# Patient Record
Sex: Female | Born: 1970 | Hispanic: No | Marital: Married
Health system: Southern US, Community
[De-identification: ages and names within clinical notes are randomized; demographics above are authoritative.]

---

## 2014-04-18 ENCOUNTER — Encounter (HOSPITAL_BASED_OUTPATIENT_CLINIC_OR_DEPARTMENT_OTHER): Payer: Self-pay

## 2014-04-18 ENCOUNTER — Emergency Department (HOSPITAL_BASED_OUTPATIENT_CLINIC_OR_DEPARTMENT_OTHER)
Admission: EM | Admit: 2014-04-18 | Discharge: 2014-04-18 | Disposition: A | Discharge status: Home / Self Care | Payer: BC Managed Care – PPO | Attending: Emergency Medicine | Admitting: Emergency Medicine

## 2014-04-18 DIAGNOSIS — N390 Urinary tract infection, site not specified: Secondary | ICD-10-CM | POA: Diagnosis not present

## 2014-04-18 DIAGNOSIS — Z3202 Encounter for pregnancy test, result negative: Secondary | ICD-10-CM | POA: Diagnosis not present

## 2014-04-18 DIAGNOSIS — R300 Dysuria: Secondary | ICD-10-CM | POA: Diagnosis present

## 2014-04-18 LAB — PREGNANCY, URINE: Preg Test, Ur: NEGATIVE

## 2014-04-18 LAB — URINALYSIS, ROUTINE W REFLEX MICROSCOPIC
BILIRUBIN URINE: NEGATIVE
Glucose, UA: NEGATIVE mg/dL
KETONES UR: 15 mg/dL — AB
Nitrite: POSITIVE — AB
PH: 5.5 (ref 5.0–8.0)
Protein, ur: NEGATIVE mg/dL
SPECIFIC GRAVITY, URINE: 1.02 (ref 1.005–1.030)
Urobilinogen, UA: 1 mg/dL (ref 0.0–1.0)

## 2014-04-18 LAB — URINE MICROSCOPIC-ADD ON

## 2014-04-18 MED ORDER — CEPHALEXIN 250 MG PO CAPS
500.0000 mg | ORAL_CAPSULE | Freq: Once | ORAL | Status: AC
  Administered 2014-04-18: 500 mg via ORAL
  Filled 2014-04-18: qty 2

## 2014-04-18 MED ORDER — PHENAZOPYRIDINE HCL 200 MG PO TABS: 200.0000 mg | ORAL_TABLET | Freq: Three times a day (TID) | ORAL | Status: DC

## 2014-04-18 MED ORDER — CEPHALEXIN 500 MG PO CAPS: 500.0000 mg | ORAL_CAPSULE | Freq: Four times a day (QID) | ORAL | Status: DC

## 2014-04-18 MED ORDER — PHENAZOPYRIDINE HCL 100 MG PO TABS
200.0000 mg | ORAL_TABLET | Freq: Once | ORAL | Status: DC
  Filled 2014-04-18: qty 2

## 2014-04-18 NOTE — ED Provider Notes (Signed)
CSN: [id number]     Arrival date & time 04/18/14  1935 History   First MD Initiated Contact with Patient 04/18/14 1943     Chief Complaint  Patient presents with  . Dysuria     (Consider location/radiation/quality/duration/timing/severity/associated sxs/prior Treatment) Patient is a 43 y.o. female presenting with dysuria. The history is provided by the patient.  Dysuria Pain quality:  Burning Duration:  2 days Timing:  Constant Progression:  Worsening Chronicity:  New Recent urinary tract infections: no   Relieved by:  Nothing Worsened by:  Nothing tried Ineffective treatments:  Phenazopyridine and cranberry juice Urinary symptoms: frequent urination   Urinary symptoms: no foul-smelling urine   Associated symptoms: no fever, no nausea, no vaginal discharge and no vomiting    Lynn Morales is a 43 y.o. female who presents to the ED with dysuria that started 2 days ago. She is not concerned about STI's as she has only sex with her husband and no vaginal discharge.   History reviewed. No pertinent past medical history. History reviewed. No pertinent past surgical history. No family history on file. History  Substance Use Topics  . Smoking status: Never Smoker   . Smokeless tobacco: Not on file  . Alcohol Use: Not on file   OB History    No data available     Review of Systems  Constitutional: Negative for fever.  Gastrointestinal: Negative for nausea and vomiting.  Genitourinary: Positive for dysuria. Negative for vaginal discharge.   All other systems negative   Allergies  Bactrim  Home Medications   Prior to Admission medications   Not on File   BP 128/75 mmHg  Pulse 98  Temp(Src) 98.6 F (37 C) (Oral)  Resp 18  Wt 145 lb (65.772 kg)  SpO2 98% Physical Exam  Constitutional: She is oriented to person, place, and time. She appears well-developed and well-nourished.  HENT:  Head: Normocephalic.  Eyes: EOM are normal.  Neck: Neck supple.   Cardiovascular: Normal rate.   Pulmonary/Chest: Effort normal.  Abdominal: Soft. There is no tenderness.  Genitourinary:  Patient does not want pelvic.  Musculoskeletal: Normal range of motion.  Neurological: She is alert and oriented to person, place, and time. No cranial nerve deficit.  Skin: Skin is warm and dry.  Psychiatric: She has a normal mood and affect. Her behavior is normal.  Nursing note and vitals reviewed.   ED Course  Procedures (including critical care time) Labs Review Results for orders placed or performed during the hospital encounter of 04/18/14 (from the past 24 hour(s))  Urinalysis, Routine w reflex microscopic     Status: Abnormal   Collection Time: 04/18/14  7:46 PM  Result Value Ref Range   Color, Urine ORANGE (A) YELLOW   APPearance CLOUDY (A) CLEAR   Specific Gravity, Urine 1.020 1.005 - 1.030   pH 5.5 5.0 - 8.0   Glucose, UA NEGATIVE NEGATIVE mg/dL   Hgb urine dipstick MODERATE (A) NEGATIVE   Bilirubin Urine NEGATIVE NEGATIVE   Ketones, ur 15 (A) NEGATIVE mg/dL   Protein, ur NEGATIVE NEGATIVE mg/dL   Urobilinogen, UA 1.0 0.0 - 1.0 mg/dL   Nitrite POSITIVE (A) NEGATIVE   Leukocytes, UA MODERATE (A) NEGATIVE  Pregnancy, urine     Status: None   Collection Time: 04/18/14  7:46 PM  Result Value Ref Range   Preg Test, Ur NEGATIVE NEGATIVE  Urine microscopic-add on     Status: Abnormal   Collection Time: 04/18/14  7:46 PM  Result Value  Ref Range   Squamous Epithelial / LPF MANY (A) RARE   WBC, UA TOO NUMEROUS TO COUNT <3 WBC/hpf   RBC / HPF 7-10 <3 RBC/hpf   Bacteria, UA MANY (A) RARE   Urine-Other FEW YEAST     MDM  43 y.o. female with dysuria, frequency and urgency x 2 days. Will treat for UTI. Stable for discharge without fever, n/v or signs of pyelo. Discussed with the patient clinical and lab findings and plan of care. All questioned fully answered. She will call return if any problems arise.    Medication List    TAKE these medications         cephALEXin 500 MG capsule  Commonly known as:  KEFLEX  Take 1 capsule (500 mg total) by mouth 4 (four) times daily.     phenazopyridine 200 MG tablet  Commonly known as:  PYRIDIUM  Take 1 tablet (200 mg total) by mouth 3 (three) times daily.           BurlingtonHope M Neese, NP 04/18/14 2306  Nelia Shiobert L Beaton, MD 04/19/14 [phone]

## 2014-04-18 NOTE — ED Notes (Signed)
Patient here with dysuria and urinary frequency x 2 days, denies fever

## 2014-04-21 LAB — URINE CULTURE: Colony Count: >=100000

## 2014-04-22 ENCOUNTER — Telehealth (HOSPITAL_BASED_OUTPATIENT_CLINIC_OR_DEPARTMENT_OTHER): Payer: Self-pay | Admitting: Emergency Medicine

## 2014-04-22 NOTE — Telephone Encounter (Signed)
Post ED Visit - Positive Culture Follow-up  Culture report reviewed by antimicrobial stewardship pharmacist: []  Wes Dulaney, Pharm.D., BCPS [x]  Celedonio MiyamotoJeremy Frens, Pharm.D., BCPS []  Georgina PillionElizabeth Martin, [address]Pharm[address]D., BCPS []  [place]h Pham, [place]arm.D., BCPS, AAHIVP []  Estella HuskMichelle Turner, Pharm.D., BCPS, AAHIVP []  Babs BertinHaley Baird, Pharm.D.   Positive urine  Culture E. Coli Treated with cephalexin, organism sensitive to the same and no further patient follow-up is required at this time.  Berle MullMiller, Sumaya Riedesel 04/22/2014, 3:58 PM

## 2017-02-26 ENCOUNTER — Ambulatory Visit: Payer: Self-pay | Admitting: Podiatry

## 2018-05-23 ENCOUNTER — Ambulatory Visit (INDEPENDENT_AMBULATORY_CARE_PROVIDER_SITE_OTHER): Payer: Medicaid Other | Admitting: Family Medicine

## 2018-05-23 ENCOUNTER — Encounter: Payer: Self-pay | Admitting: Family Medicine

## 2018-05-23 VITALS — BP 124/75 | HR 72 | Ht 64.0 in | Wt 160.0 lb

## 2018-05-23 DIAGNOSIS — Z30433 Encounter for removal and reinsertion of intrauterine contraceptive device: Secondary | ICD-10-CM

## 2018-05-23 MED ORDER — LEVONORGESTREL 20 MCG/24HR IU IUD
INTRAUTERINE_SYSTEM | Freq: Once | INTRAUTERINE | Status: AC
  Administered 2018-05-23: 1 via INTRAUTERINE

## 2018-05-23 NOTE — Patient Instructions (Signed)

## 2018-05-23 NOTE — Progress Notes (Signed)
  IUD Removal  Patient was in the dorsal lithotomy position, normal external genitalia was noted.  A speculum was placed in the patient's vagina, normal discharge was noted, no lesions. The multiparous cervix was visualized, no lesions, no abnormal discharge,  and was swabbed with Betadine using scopettes.  The strings of the IUD was grasped and pulled using ring forceps.  The IUD was successfully removed in its entirety.  Patient tolerated the procedure well.    IUD Procedure Note Patient identified, informed consent performed, signed copy in chart, time out was performed.  Urine pregnancy test negative.  Speculum placed in the vagina.  Cervix visualized.  Cleaned with Betadine x 2.  Grasped anteriorly with a single tooth tenaculum.  Uterus sounded to 8 cm.  Mirena  IUD placed per manufacturer's recommendations.  Strings trimmed to 3 cm. Tenaculum was removed, good hemostasis noted.  Patient tolerated procedure well.   Patient given post procedure instructions and Mirena care card with expiration date.  Patient is asked to check IUD strings periodically and follow up in 4-6 weeks for IUD check.

## 2018-05-23 NOTE — Addendum Note (Signed)
Addended by: Lorelle GibbsWILSON, Vendetta Pittinger L on: 05/23/2018 11:11 AM   Modules accepted: Orders

## 2018-06-20 ENCOUNTER — Other Ambulatory Visit (HOSPITAL_COMMUNITY)
Admission: RE | Admit: 2018-06-20 | Discharge: 2018-06-20 | Disposition: A | Discharge status: Home / Self Care | Payer: Medicaid Other | Source: Ambulatory Visit | Attending: Obstetrics & Gynecology | Admitting: Obstetrics & Gynecology

## 2018-06-20 ENCOUNTER — Ambulatory Visit (INDEPENDENT_AMBULATORY_CARE_PROVIDER_SITE_OTHER): Payer: Medicaid Other | Admitting: Obstetrics & Gynecology

## 2018-06-20 ENCOUNTER — Encounter: Payer: Self-pay | Admitting: Obstetrics & Gynecology

## 2018-06-20 VITALS — BP 146/84 | HR 75 | Ht 64.0 in | Wt 160.0 lb

## 2018-06-20 DIAGNOSIS — Z01419 Encounter for gynecological examination (general) (routine) without abnormal findings: Secondary | ICD-10-CM | POA: Diagnosis present

## 2018-06-20 DIAGNOSIS — Z0000 Encounter for general adult medical examination without abnormal findings: Secondary | ICD-10-CM | POA: Diagnosis not present

## 2018-06-20 DIAGNOSIS — Z1239 Encounter for other screening for malignant neoplasm of breast: Secondary | ICD-10-CM | POA: Diagnosis present

## 2018-06-20 NOTE — Patient Instructions (Signed)
Mammogram  A mammogram is an X-ray of the breasts that is done to check for abnormal changes. This procedure can screen for and detect any changes that may suggest breast cancer. A mammogram can also identify other changes and variations in the breast, such as:   Inflammation of the breast tissue (mastitis).   An infected area that contains a collection of pus (abscess).   A fluid-filled sac (cyst).   Fibrocystic changes. This is when breast tissue becomes denser, which can make the tissue feel rope-like or uneven under the skin.   Tumors that are not cancerous (benign).  Tell a health care provider about:   Any allergies you have.   If you have breast implants.   If you have had previous breast disease, biopsy, or surgery.   If you are breastfeeding.   Any possibility that you could be pregnant, if this applies.   If you are younger than age 25.   If you have a family history of breast cancer.  What are the risks?  Generally, this is a safe procedure. However, problems may occur, including:   Exposure to radiation. Radiation levels are very low with this test.   The results being misinterpreted.   The need for further tests.   The inability of the mammogram to detect certain cancers.  What happens before the procedure?   Schedule your test about 1-2 weeks after your menstrual period. This is usually when your breasts are the least tender.   If you have had a mammogram done at a different facility in the past, get the mammogram X-rays or have them sent to your current exam facility in order to compare them.   Wash your breasts and under your arms the day of the test.   Do not wear deodorants, perfumes, lotions, or powders anywhere on your body on the day of the test.   Remove any jewelry from your neck.   Wear clothes that you can change into and out of easily.  What happens during the procedure?   You will undress from the waist up and put on a gown.   You will stand in front of the X-ray  machine.   Each breast will be placed between two plastic or glass plates. The plates will compress your breast for a few seconds. Try to stay as relaxed as possible during the procedure. This does not cause any harm to your breasts and any discomfort you feel will be very brief.   X-rays will be taken from different angles of each breast.  The procedure may vary among health care providers and hospitals.  What happens after the procedure?   The mammogram will be examined by a specialist (radiologist).   You may need to repeat certain parts of the test, depending on the quality of the images. This is commonly done if the radiologist needs a better view of the breast tissue.   Ask when your test results will be ready. Make sure you get your test results.   You may resume your normal activities.  Summary   A mammogram is an X-ray of the breasts that is done to check for abnormal changes. This procedure can screen for and detect any changes that may suggest breast cancer.   If you have had a mammogram done at a different facility in the past, get the mammogram X-rays or have them sent to your current exam facility in order to compare them.   Ask when your test   results will be ready. Make sure you get your test results.  This information is not intended to replace advice given to you by your health care provider. Make sure you discuss any questions you have with your health care provider.  Document Released: 05/26/2000 Document Revised: 01/11/2017 Document Reviewed: 08/07/2014  Elsevier Interactive Patient Education  2019 Elsevier Inc[employer]

## 2018-06-20 NOTE — Progress Notes (Signed)
Subjective:     Lynn Morales is a 48 y.o. female here for a routine exam. [id number] LMP LnIUD Nov 2019 amenorrhea with LnIUD. Pt has LnIUD placed 05/23/2018.  Pt denies problems voiding on incontinence.     Pt does not have a primary care provider.    Gynecologic History No LMP recorded. (Menstrual status: IUD). Contraception: IUD Last Pap: 2018 Results were: normal [place] Last mammogram: maybe last year not sure. Thinks she was scheudled for every other year results were: normal  Obstetric History OB History  Gravida Para Term Preterm AB Living  4 2 2   2 2   SAB TAB Ectopic Multiple Live Births  1 1     2     # Outcome Date GA Lbr Len/2nd Weight Sex Delivery Anes PTL Lv  4 Term 2007 [redacted]w[redacted]d   M Vag-Spont EPI  LIV  3 TAB 2006          2 Term 2001 [redacted]w[redacted]d   F Vag-Spont EPI  LIV  1 SAB 2000           The following portions of the patient's history were reviewed and updated as appropriate: allergies, current medications, past family history, past medical history, past social history, past surgical history and problem list.  Review of Systems Pertinent items are noted in HPI.    Objective:  BP (!) 146/84   Pulse 75   Ht 5\' 4"  (1.626 m)   Wt 160 lb (72.6 kg)   BMI 27.46 kg/m   General Appearance:    Alert, cooperative, no distress, appears stated age  Head:    Normocephalic, without obvious abnormality, atraumatic  Eyes:    conjunctiva/corneas clear, EOM's intact, both eyes  Ears:    Normal external ear canals, both ears  Nose:   Nares normal, septum midline, mucosa normal, no drainage    or sinus tenderness  Throat:   Lips, mucosa, and tongue normal; teeth and gums normal  Neck:   Supple, symmetrical, trachea midline, no adenopathy;    thyroid:  no enlargement/tenderness/nodules  Back:     Symmetric, no curvature, ROM normal, no CVA tenderness  Lungs:     Clear to auscultation bilaterally, respirations unlabored  Chest Wall:    No tenderness or deformity   Heart:    Regular  rate and rhythm, S1 and S2 normal, no murmur, rub   or gallop  Breast Exam:    No tenderness, masses, or nipple abnormality  Abdomen:     Soft, non-tender, bowel sounds active all four quadrants,    no masses, no organomegaly  Genitalia:    Normal female without lesion, discharge or tenderness     Extremities:   Extremities normal, atraumatic, no cyanosis or edema  Pulses:   2+ and symmetric all extremities  Skin:   Skin color, texture, turgor normal, no rashes or lesions     Assessment:    Healthy female exam.   breast cancer screening    Plan:    Mammogram ordered. Follow up in: 1 year.    Labs: CBC, CMP, TSH, HgbA1c, Lipid panel  Theresia Pree L. Harraway-Smith, M.D., [place]

## 2018-06-21 LAB — CBC
Hematocrit: 41 % (ref 34.0–46.6)
Hemoglobin: 14.1 g/dL (ref 11.1–15.9)
MCH: 29.1 pg (ref 26.6–33.0)
MCHC: 34.4 g/dL (ref 31.5–35.7)
MCV: 85 fL (ref 79–97)
Platelets: 296 10*3/uL (ref 150–450)
RBC: 4.85 x10E6/uL (ref 3.77–5.28)
RDW: 12.7 % (ref 11.7–15.4)
WBC: 8.9 10*3/uL (ref 3.4–10.8)

## 2018-06-21 LAB — COMPREHENSIVE METABOLIC PANEL
A/G RATIO: 1.1 — AB (ref 1.2–2.2)
ALBUMIN: 4.1 g/dL (ref 3.5–5.5)
ALT: 10 IU/L (ref 0–32)
AST: 13 IU/L (ref 0–40)
Alkaline Phosphatase: 61 IU/L (ref 39–117)
BUN/Creatinine Ratio: 14 (ref 9–23)
BUN: 10 mg/dL (ref 6–24)
Bilirubin Total: 0.3 mg/dL (ref 0.0–1.2)
CO2: 24 mmol/L (ref 20–29)
Calcium: 9.8 mg/dL (ref 8.7–10.2)
Chloride: 98 mmol/L (ref 96–106)
Creatinine, Ser: 0.7 mg/dL (ref 0.57–1.00)
GFR calc Af Amer: 119 mL/min/{1.73_m2} (ref >59)
GFR calc non Af Amer: 104 mL/min/{1.73_m2} (ref >59)
Globulin, Total: 3.9 g/dL (ref 1.5–4.5)
Glucose: 85 mg/dL (ref 65–99)
Potassium: 4.2 mmol/L (ref 3.5–5.2)
Sodium: 135 mmol/L (ref 134–144)
Total Protein: 8 g/dL (ref 6.0–8.5)

## 2018-06-21 LAB — CYTOLOGY - PAP
Diagnosis: NEGATIVE
HPV: NOT DETECTED

## 2018-06-21 LAB — HEMOGLOBIN A1C
Est. average glucose Bld gHb Est-mCnc: 111 mg/dL
Hgb A1c MFr Bld: 5.5 % (ref 4.8–5.6)

## 2018-06-21 LAB — TSH: TSH: 6.81 u[IU]/mL — ABNORMAL HIGH (ref 0.450–4.500)

## 2018-06-25 LAB — T4: T4, Total: 9.4 ug/dL (ref 4.5–12.0)

## 2018-06-25 LAB — T3: T3, Total: 103 ng/dL (ref 71–180)

## 2018-06-27 ENCOUNTER — Ambulatory Visit (HOSPITAL_BASED_OUTPATIENT_CLINIC_OR_DEPARTMENT_OTHER)
Admission: RE | Admit: 2018-06-27 | Discharge: 2018-06-27 | Disposition: A | Discharge status: Home / Self Care | Payer: Medicaid Other | Source: Ambulatory Visit | Attending: Obstetrics & Gynecology | Admitting: Obstetrics & Gynecology

## 2018-06-27 DIAGNOSIS — Z1239 Encounter for other screening for malignant neoplasm of breast: Secondary | ICD-10-CM | POA: Diagnosis present

## 2018-06-27 DIAGNOSIS — Z01419 Encounter for gynecological examination (general) (routine) without abnormal findings: Secondary | ICD-10-CM | POA: Insufficient documentation

## 2018-06-28 ENCOUNTER — Other Ambulatory Visit: Payer: Self-pay | Admitting: Obstetrics & Gynecology

## 2018-06-28 DIAGNOSIS — R928 Other abnormal and inconclusive findings on diagnostic imaging of breast: Secondary | ICD-10-CM

## 2018-07-04 ENCOUNTER — Ambulatory Visit
Admission: RE | Admit: 2018-07-04 | Discharge: 2018-07-04 | Disposition: A | Discharge status: Home / Self Care | Payer: Medicaid Other | Source: Ambulatory Visit | Attending: Obstetrics & Gynecology | Admitting: Obstetrics & Gynecology

## 2018-07-04 DIAGNOSIS — R928 Other abnormal and inconclusive findings on diagnostic imaging of breast: Secondary | ICD-10-CM

## 2019-10-30 ENCOUNTER — Telehealth: Payer: Self-pay

## 2019-10-30 ENCOUNTER — Emergency Department (INDEPENDENT_AMBULATORY_CARE_PROVIDER_SITE_OTHER)
Admission: EM | Admit: 2019-10-30 | Discharge: 2019-10-30 | Disposition: A | Discharge status: Home / Self Care | Payer: Medicaid Other | Source: Home / Self Care

## 2019-10-30 ENCOUNTER — Other Ambulatory Visit: Payer: Self-pay

## 2019-10-30 DIAGNOSIS — R110 Nausea: Secondary | ICD-10-CM | POA: Diagnosis not present

## 2019-10-30 DIAGNOSIS — R8290 Unspecified abnormal findings in urine: Secondary | ICD-10-CM

## 2019-10-30 DIAGNOSIS — R1084 Generalized abdominal pain: Secondary | ICD-10-CM

## 2019-10-30 LAB — POCT CBC W AUTO DIFF (K'VILLE URGENT CARE)

## 2019-10-30 LAB — COMPLETE METABOLIC PANEL WITH GFR
AG Ratio: 1.2 (calc) (ref 1.0–2.5)
ALT: 12 U/L (ref 6–29)
AST: 16 U/L (ref 10–35)
Albumin: 4.2 g/dL (ref 3.6–5.1)
Alkaline phosphatase (APISO): 55 U/L (ref 31–125)
BUN: 8 mg/dL (ref 7–25)
CO2: 29 mmol/L (ref 20–32)
Calcium: 9.7 mg/dL (ref 8.6–10.2)
Chloride: 102 mmol/L (ref 98–110)
Creat: 0.66 mg/dL (ref 0.50–1.10)
GFR, Est African American: 121 mL/min/{1.73_m2} (ref >=60)
GFR, Est Non African American: 104 mL/min/{1.73_m2} (ref >=60)
Globulin: 3.6 g/dL (calc) (ref 1.9–3.7)
Glucose, Bld: 93 mg/dL (ref 65–99)
Potassium: 4.1 mmol/L (ref 3.5–5.3)
Sodium: 137 mmol/L (ref 135–146)
Total Bilirubin: 0.3 mg/dL (ref 0.2–1.2)
Total Protein: 7.8 g/dL (ref 6.1–8.1)

## 2019-10-30 LAB — POCT URINALYSIS DIP (MANUAL ENTRY)
Bilirubin, UA: NEGATIVE
Glucose, UA: NEGATIVE mg/dL
Ketones, POC UA: NEGATIVE mg/dL
Nitrite, UA: NEGATIVE
Protein Ur, POC: NEGATIVE mg/dL
Spec Grav, UA: 1.02 (ref 1.010–1.025)
Urobilinogen, UA: 0.2 E.U./dL
pH, UA: 8.5 — AB (ref 5.0–8.0)

## 2019-10-30 LAB — LIPASE: Lipase: 24 U/L (ref 7–60)

## 2019-10-30 MED ORDER — ONDANSETRON 4 MG PO TBDP: 4.0000 mg | ORAL_TABLET | Freq: Three times a day (TID) | ORAL | 0 refills | Status: DC | PRN

## 2019-10-30 NOTE — ED Triage Notes (Addendum)
Pt c/o intermittent stomach pain x 3 days. No diarrhea. Some nausea, no vomiting. Says she took tums before coming into UC, which helped some. Pain 5/10

## 2019-10-30 NOTE — Telephone Encounter (Signed)
Pt notified of normal lab results. Will be notified if anything comes back abnormal on the UCX.

## 2019-10-30 NOTE — ED Provider Notes (Signed)
[facility]    CSN: [id number] Arrival date & time: 10/30/19  1252      History   Chief Complaint No chief complaint on file.   HPI Lynn Morales is a 49 y.o. female.   HPI  Lynn Morales is a 49 y.o. female presenting to UC with c/o intermittent generalized abdominal pain with associated nausea without vomiting or diarrhea.  Symptoms started 3 days ago. Pain has improved some since initial onset.  She took Tums PTA, with moderate relief. Pain is 5/10 at worst. Denies fever, chills. Denies urinary or URI symptoms. No sick contacts or recent travel. Denies chest pain or back pain. No hx of kidney stones. LMP: pt has an IUD, no longer has periods.   No past medical history on file.  There are no problems to display for this patient.   No past surgical history on file.  OB History    Gravida  4   Para  2   Term  2   Preterm      AB  2   Living  2     SAB  1   TAB  1   Ectopic      Multiple      Live Births  2            Home Medications    Prior to Admission medications   Medication Sig Start Date End Date Taking? Authorizing Provider  fexofenadine (ALLEGRA) 180 MG tablet Take 180 mg by mouth daily.    [provider]  Multiple Vitamin (MULTIVITAMIN) capsule Take 1 capsule by mouth daily.    [provider]  ondansetron (ZOFRAN ODT) 4 MG disintegrating tablet Take 1 tablet (4 mg total) by mouth every 8 (eight) hours as needed for nausea or vomiting. 10/30/19   Lurene Shadow, PA-C    Family History Family History  Problem Relation Age of Onset  . Diabetes Mother     Social History Social History   Tobacco Use  . Smoking status: Never Smoker  . Smokeless tobacco: Never Used  Substance Use Topics  . Alcohol use: Never  . Drug use: Never     Allergies   Patient has no known allergies.   Review of Systems Review of Systems  Constitutional: Negative for chills and fever.  HENT: Negative for congestion,  ear pain, sore throat, trouble swallowing and voice change.   Respiratory: Negative for cough and shortness of breath.   Cardiovascular: Negative for chest pain and palpitations.  Gastrointestinal: Positive for abdominal pain and nausea. Negative for diarrhea and vomiting.  Musculoskeletal: Negative for arthralgias, back pain and myalgias.  Skin: Negative for rash.  Neurological: Negative for dizziness, light-headedness and headaches.  All other systems reviewed and are negative.    Physical Exam Triage Vital Signs ED Triage Vitals  Enc Vitals Group     BP 10/30/19 1308 (!) 145/90     Pulse Rate 10/30/19 1308 77     Resp 10/30/19 1308 18     Temp 10/30/19 1308 98.4 F (36.9 C)     Temp Source 10/30/19 1308 Oral     SpO2 10/30/19 1308 98 %     Weight --      Height --      Head Circumference --      Peak Flow --      Pain Score 10/30/19 1310 5     Pain Loc --      Pain Edu? --  Excl. in GC? --    No data found.  Updated Vital Signs BP (!) 145/90 (BP Location: Left Arm)   Pulse 77   Temp 98.4 F (36.9 C) (Oral)   Resp 18   SpO2 98%   Visual Acuity Right Eye Distance:   Left Eye Distance:   Bilateral Distance:    Right Eye Near:   Left Eye Near:    Bilateral Near:     Physical Exam Vitals and nursing note reviewed.  Constitutional:      General: She is not in acute distress.    Appearance: Normal appearance. She is well-developed. She is not ill-appearing, toxic-appearing or diaphoretic.  HENT:     Head: Normocephalic and atraumatic.     Mouth/Throat:     Mouth: Mucous membranes are moist.  Eyes:     General: No scleral icterus.    Conjunctiva/sclera: Conjunctivae normal.  Cardiovascular:     Rate and Rhythm: Normal rate and regular rhythm.  Pulmonary:     Effort: Pulmonary effort is normal. No respiratory distress.     Breath sounds: Normal breath sounds.  Abdominal:     General: There is no distension.     Palpations: Abdomen is soft. There is  no mass.     Tenderness: There is generalized abdominal tenderness. There is no right CVA tenderness, left CVA tenderness, guarding or rebound.     Hernia: No hernia is present.  Musculoskeletal:        General: Normal range of motion.     Cervical back: Normal range of motion.  Skin:    General: Skin is warm and dry.  Neurological:     Mental Status: She is alert and oriented to person, place, and time.  Psychiatric:        Behavior: Behavior normal.      UC Treatments / Results  Labs (all labs ordered are listed, but only abnormal results are displayed) Labs Reviewed  POCT URINALYSIS DIP (MANUAL ENTRY) - Abnormal; Notable for the following components:      Result Value   Blood, UA trace-lysed (*)    pH, UA 8.5 (*)    Leukocytes, UA Small (1+) (*)    All other components within normal limits  URINE CULTURE  COMPLETE METABOLIC PANEL WITH GFR  LIPASE  POCT CBC W AUTO DIFF ([facility])    EKG   Radiology No results found.  Procedures Procedures (including critical care time)  Medications Ordered in UC Medications - No data to display  Initial Impression / Assessment and Plan / UC Course  I have reviewed the triage vital signs and the nursing notes.  Pertinent labs & imaging results that were available during my care of the patient were reviewed by me and considered in my medical decision making (see chart for details).     Pt appears well, NAD  Mild diffuse tenderness on abdominal exam, no evidence of acute abdomen. CBC: unremarkable UA: small leukocytes, however, pt denies urinary symptoms, will hold off on antibiotics while culture pending. CMP and Lipase: stat labs pending Suspect viral illness at this time. Discussed symptoms that warrant emergent care in the ED. AVS provided.  Final Clinical Impressions(s) / UC Diagnoses   Final diagnoses:  Generalized abdominal pain  Nausea without vomiting  Abnormal finding on urinalysis     Discharge  Instructions      Be sure to get a lot of rest and stay well hydrated with sports drinks, water, diluted juices,  and clear sodas.  Avoid fried fatty food, spicy food, and milk as these foods can cause worsening stomach upset.   You will be notified later today of your lab results, especially if abnormal. If symptoms worsen between now and then- worsening abdominal pain, unable to keep down fluids, blood in stool, dizziness/passing out, or other new concerning symptoms develop, please call 911 or have someone drive you to the closest hospital.     ED Prescriptions    Medication Sig Dispense Auth. Provider   ondansetron (ZOFRAN ODT) 4 MG disintegrating tablet Take 1 tablet (4 mg total) by mouth every 8 (eight) hours as needed for nausea or vomiting. 10 tablet Lurene Shadow, PA-C     PDMP not reviewed this encounter.   Lurene Shadow, [place] 10/30/19 1447

## 2019-10-30 NOTE — Discharge Instructions (Signed)
  Be sure to get a lot of rest and stay well hydrated with sports drinks, water, diluted juices, and clear sodas.  Avoid fried fatty food, spicy food, and milk as these foods can cause worsening stomach upset.   You will be notified later today of your lab results, especially if abnormal. If symptoms worsen between now and then- worsening abdominal pain, unable to keep down fluids, blood in stool, dizziness/passing out, or other new concerning symptoms develop, please call 911 or have someone drive you to the closest hospital.

## 2019-10-31 ENCOUNTER — Encounter (HOSPITAL_BASED_OUTPATIENT_CLINIC_OR_DEPARTMENT_OTHER): Payer: Self-pay

## 2019-10-31 LAB — URINE CULTURE
MICRO NUMBER:: 10500907
SPECIMEN QUALITY:: ADEQUATE

## 2020-03-12 IMAGING — MG DIGITAL SCREENING BILATERAL MAMMOGRAM WITH TOMO AND CAD
8 series · 8 of 24 positions shown · non-contrast
Comparison: None

CLINICAL DATA: Screening.

EXAM:
DIGITAL SCREENING BILATERAL MAMMOGRAM WITH TOMO AND CAD

[R MLO synth-2D]
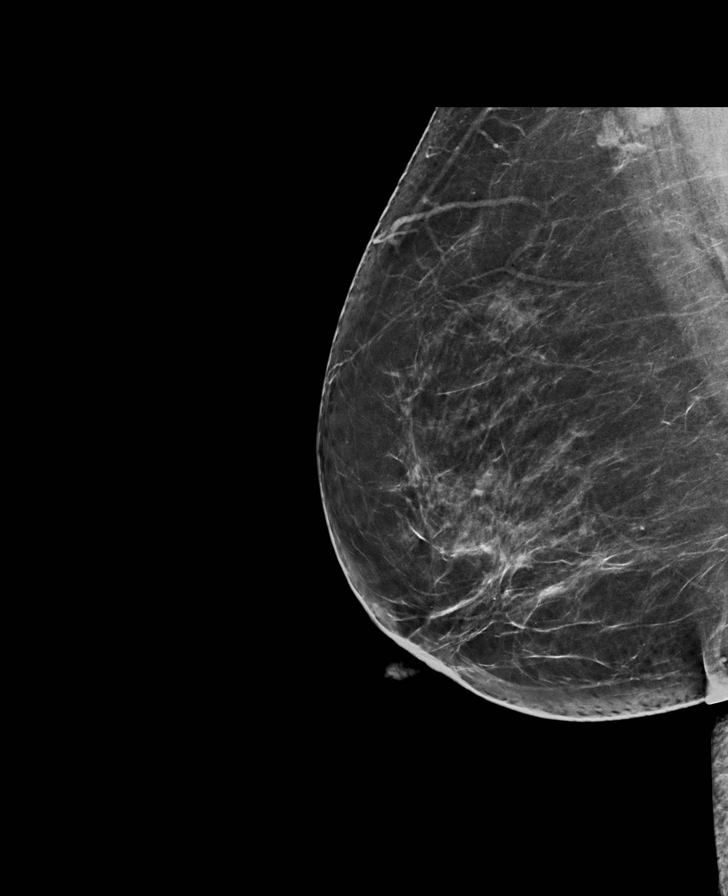

[L CC synth-2D]
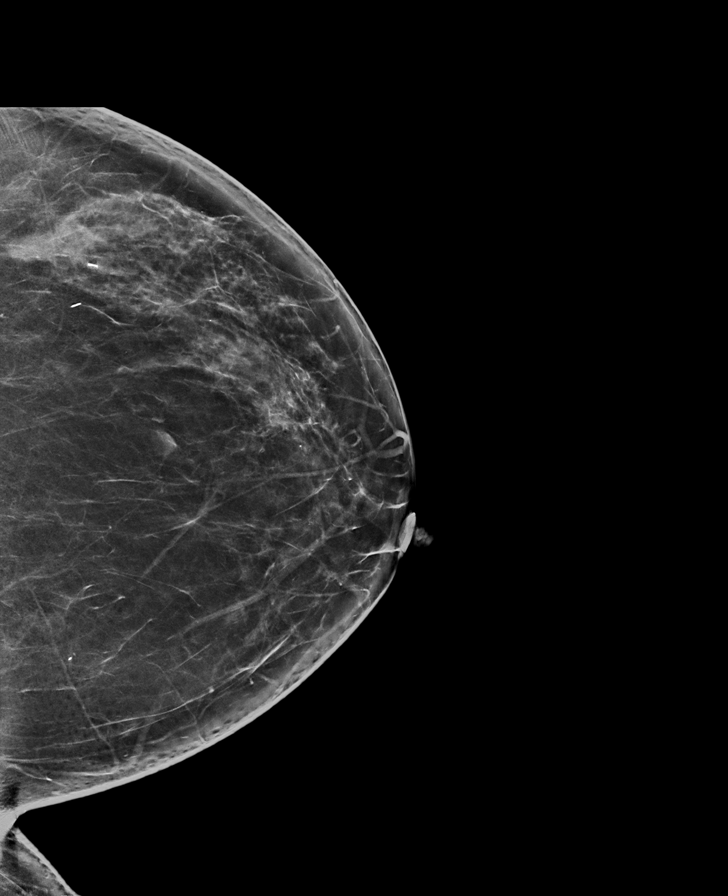

[R CC synth-2D]
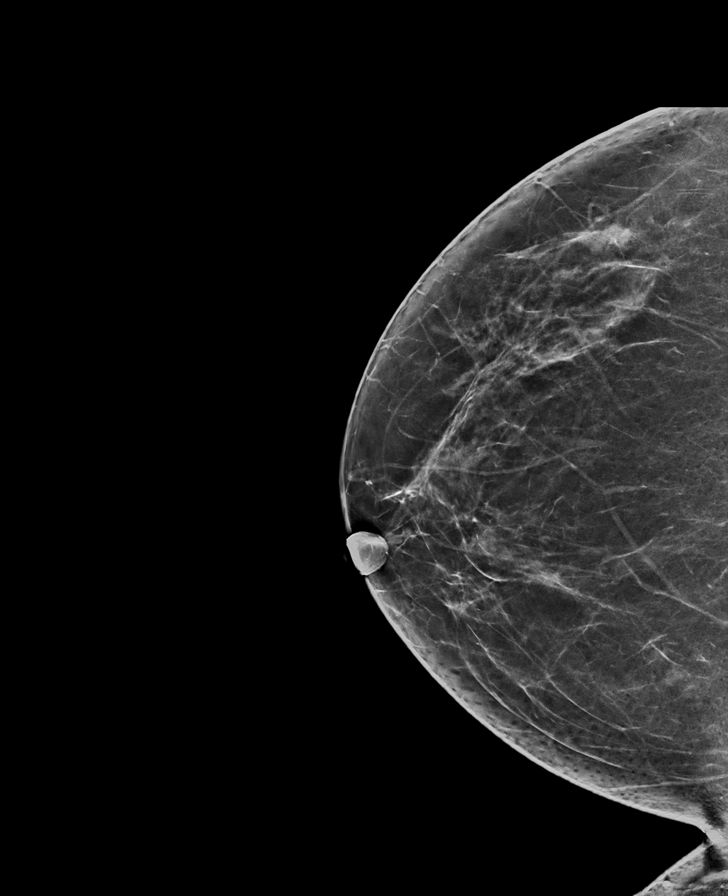

[L MLO synth-2D]
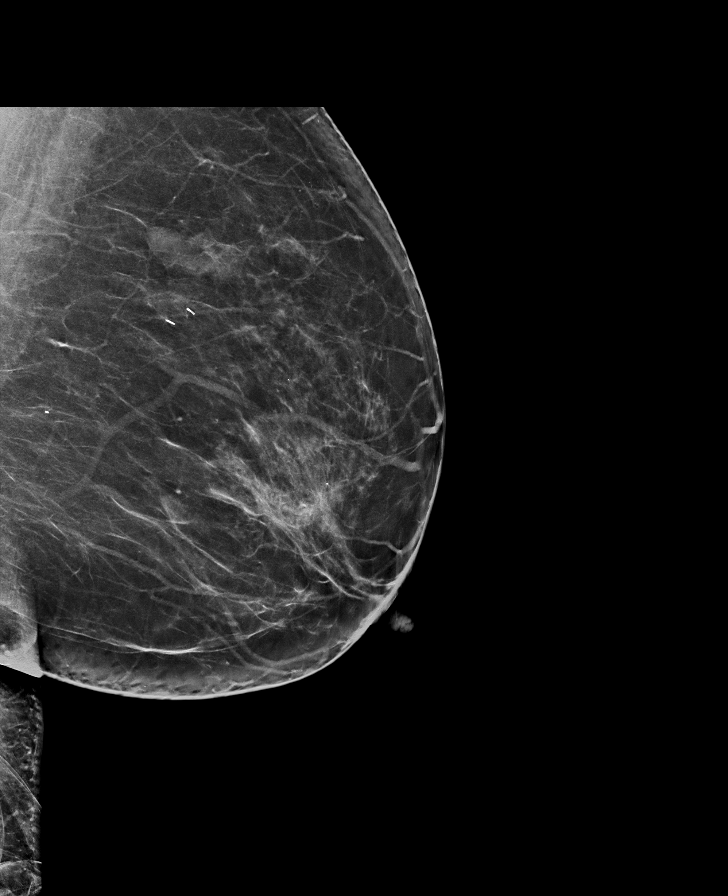

[R MLO tomo · tomo slice 40/79.0]
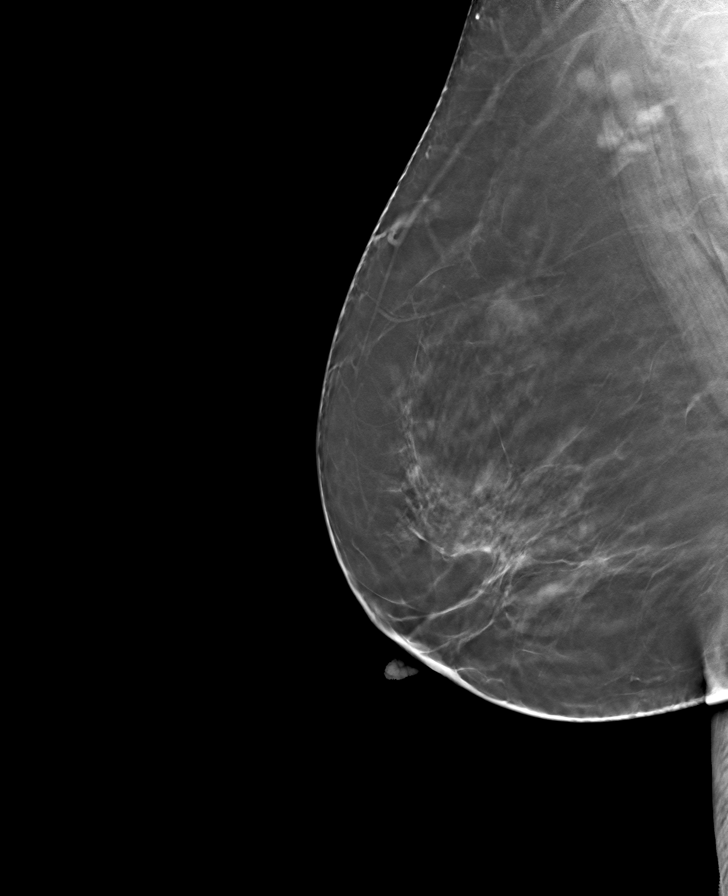

[L CC tomo · tomo slice 38/75.0]
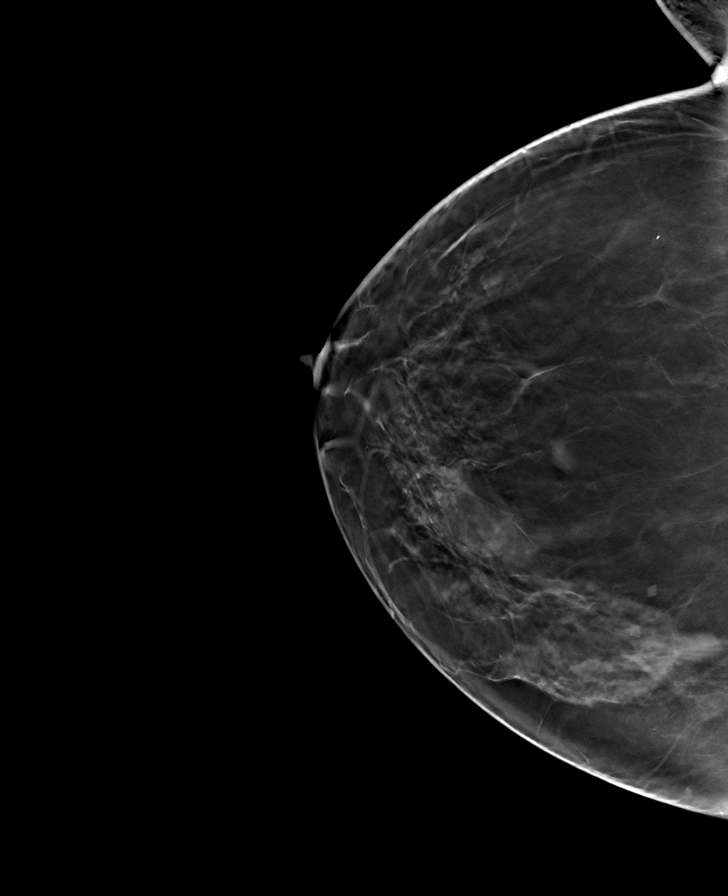

[R CC tomo · tomo slice 35/70.0]
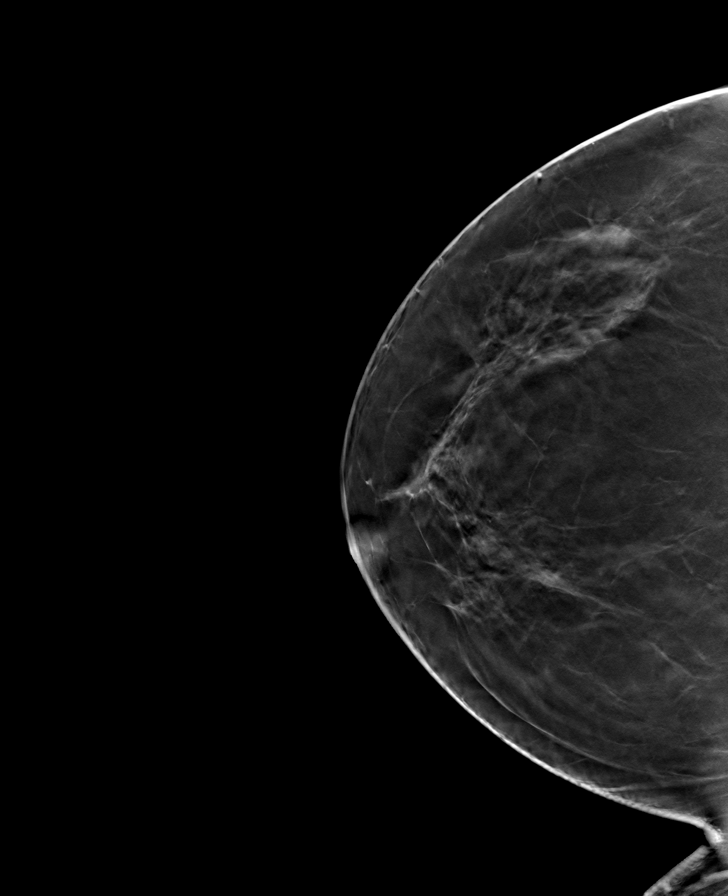

[L MLO tomo · tomo slice 41/80.0]
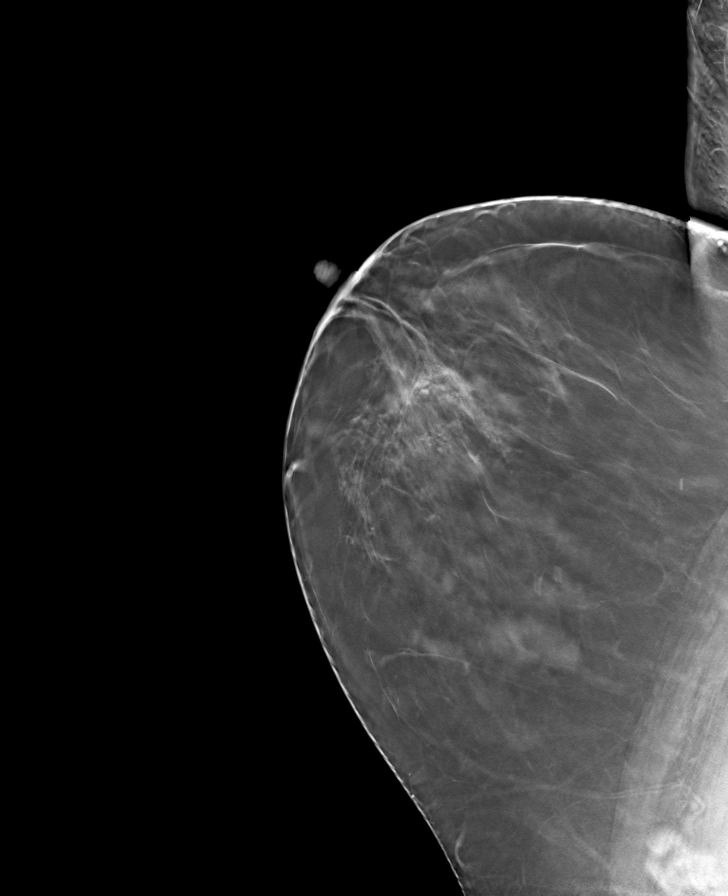

[8 of 24 positions shown; findings below may reference images not displayed]

ACR Breast Density Category b: There are scattered areas of
fibroglandular density.
FINDINGS: In both breasts, possible masses warrant further evaluation.

Images were processed with CAD.
IMPRESSION: Further evaluation is suggested for possible masses in the right and
left breasts.

RECOMMENDATION:
Diagnostic mammogram and possibly ultrasound of the right and left
breasts. (Code:[id number]-K-33E)

The patient will be contacted regarding the findings, and additional
imaging will be scheduled.

BI-RADS CATEGORY  0: Incomplete. Need additional imaging evaluation
and/or prior mammograms for comparison.

## 2020-12-31 ENCOUNTER — Encounter: Payer: Self-pay | Admitting: Podiatry

## 2020-12-31 ENCOUNTER — Ambulatory Visit: Payer: Medicaid Other | Admitting: Podiatry

## 2020-12-31 ENCOUNTER — Ambulatory Visit (INDEPENDENT_AMBULATORY_CARE_PROVIDER_SITE_OTHER): Payer: Medicaid Other

## 2020-12-31 ENCOUNTER — Ambulatory Visit (INDEPENDENT_AMBULATORY_CARE_PROVIDER_SITE_OTHER): Payer: Medicaid Other | Admitting: Podiatry

## 2020-12-31 ENCOUNTER — Other Ambulatory Visit: Payer: Self-pay

## 2020-12-31 DIAGNOSIS — G8929 Other chronic pain: Secondary | ICD-10-CM | POA: Diagnosis not present

## 2020-12-31 DIAGNOSIS — M79671 Pain in right foot: Secondary | ICD-10-CM | POA: Diagnosis not present

## 2020-12-31 DIAGNOSIS — M722 Plantar fascial fibromatosis: Secondary | ICD-10-CM

## 2020-12-31 MED ORDER — UREA 40 % EX LOTN: 1.0000 "application " | TOPICAL_LOTION | Freq: Every day | CUTANEOUS | 2 refills | Status: AC | PRN

## 2020-12-31 MED ORDER — MELOXICAM 15 MG PO TABS: 15.0000 mg | ORAL_TABLET | Freq: Every day | ORAL | 0 refills | Status: AC | PRN

## 2020-12-31 NOTE — Patient Instructions (Signed)
For instructions on how to put on your Plantar Fascial Brace, please visit [email or web address]   Plantar Fasciitis (Heel Spur Syndrome) with Rehab The plantar fascia is a fibrous, ligament-like, soft-tissue structure that spans the bottom of the foot. Plantar fasciitis is a condition that causes pain in the foot due to inflammation of the tissue. SYMPTOMS   Pain and tenderness on the underneath side of the foot.  Pain that worsens with standing or walking. CAUSES  Plantar fasciitis is caused by irritation and injury to the plantar fascia on the underneath side of the foot. Common mechanisms of injury include:  Direct trauma to bottom of the foot.  Damage to a small nerve that runs under the foot where the main fascia attaches to the heel bone.  Stress placed on the plantar fascia due to bone spurs. RISK INCREASES WITH:   Activities that place stress on the plantar fascia (running, jumping, pivoting, or cutting).  Poor strength and flexibility.  Improperly fitted shoes.  Tight calf muscles.  Flat feet.  Failure to warm-up properly before activity.  Obesity. PREVENTION  Warm up and stretch properly before activity.  Allow for adequate recovery between workouts.  Maintain physical fitness:  Strength, flexibility, and endurance.  Cardiovascular fitness.  Maintain a health body weight.  Avoid stress on the plantar fascia.  Wear properly fitted shoes, including arch supports for individuals who have flat feet.  PROGNOSIS  If treated properly, then the symptoms of plantar fasciitis usually resolve without surgery. However, occasionally surgery is necessary.  RELATED COMPLICATIONS   Recurrent symptoms that may result in a chronic condition.  Problems of the lower back that are caused by compensating for the injury, such as limping.  Pain or weakness of the foot during push-off following surgery.  Chronic inflammation, scarring, and partial or complete  fascia tear, occurring more often from repeated injections.  TREATMENT  Treatment initially involves the use of ice and medication to help reduce pain and inflammation. The use of strengthening and stretching exercises may help reduce pain with activity, especially stretches of the Achilles tendon. These exercises may be performed at home or with a therapist. Your caregiver may recommend that you use heel cups of arch supports to help reduce stress on the plantar fascia. Occasionally, corticosteroid injections are given to reduce inflammation. If symptoms persist for greater than 6 months despite non-surgical (conservative), then surgery may be recommended.   MEDICATION   If pain medication is necessary, then nonsteroidal anti-inflammatory medications, such as aspirin and ibuprofen, or other minor pain relievers, such as acetaminophen, are often recommended.  Do not take pain medication within 7 days before surgery.  Prescription pain relievers may be given if deemed necessary by your caregiver. Use only as directed and only as much as you need.  Corticosteroid injections may be given by your caregiver. These injections should be reserved for the most serious cases, because they may only be given a certain number of times.  HEAT AND COLD  Cold treatment (icing) relieves pain and reduces inflammation. Cold treatment should be applied for 10 to 15 minutes every 2 to 3 hours for inflammation and pain and immediately after any activity that aggravates your symptoms. Use ice packs or massage the area with a piece of ice (ice massage).  Heat treatment may be used prior to performing the stretching and strengthening activities prescribed by your caregiver, physical therapist, or athletic trainer. Use a heat pack or soak the injury in warm water.  SEEK IMMEDIATE MEDICAL   CARE IF:  Treatment seems to offer no benefit, or the condition worsens.  Any medications produce adverse side effects.   EXERCISES- RANGE OF MOTION (ROM) AND STRETCHING EXERCISES - Plantar Fasciitis (Heel Spur Syndrome) These exercises may help you when beginning to rehabilitate your injury. Your symptoms may resolve with or without further involvement from your physician, physical therapist or athletic trainer. While completing these exercises, remember:   Restoring tissue flexibility helps normal motion to return to the joints. This allows healthier, less painful movement and activity.  An effective stretch should be held for at least 30 seconds.  A stretch should never be painful. You should only feel a gentle lengthening or release in the stretched tissue.  RANGE OF MOTION - Toe Extension, Flexion  Sit with your right / left leg crossed over your opposite knee.  Grasp your toes and gently pull them back toward the top of your foot. You should feel a stretch on the bottom of your toes and/or foot.  Hold this stretch for 10 seconds.  Now, gently pull your toes toward the bottom of your foot. You should feel a stretch on the top of your toes and or foot.  Hold this stretch for 10 seconds. Repeat  times. Complete this stretch 3 times per day.   RANGE OF MOTION - Ankle Dorsiflexion, Active Assisted  Remove shoes and sit on a chair that is preferably not on a carpeted surface.  Place right / left foot under knee. Extend your opposite leg for support.  Keeping your heel down, slide your right / left foot back toward the chair until you feel a stretch at your ankle or calf. If you do not feel a stretch, slide your bottom forward to the edge of the chair, while still keeping your heel down.  Hold this stretch for 10 seconds. Repeat 3 times. Complete this stretch 2 times per day.   STRETCH  Gastroc, Standing  Place hands on wall.  Extend right / left leg, keeping the front knee somewhat bent.  Slightly point your toes inward on your back foot.  Keeping your right / left heel on the floor and your  knee straight, shift your weight toward the wall, not allowing your back to arch.  You should feel a gentle stretch in the right / left calf. Hold this position for 10 seconds. Repeat 3 times. Complete this stretch 2 times per day.  STRETCH  Soleus, Standing  Place hands on wall.  Extend right / left leg, keeping the other knee somewhat bent.  Slightly point your toes inward on your back foot.  Keep your right / left heel on the floor, bend your back knee, and slightly shift your weight over the back leg so that you feel a gentle stretch deep in your back calf.  Hold this position for 10 seconds. Repeat 3 times. Complete this stretch 2 times per day.  STRETCH  Gastrocsoleus, Standing  Note: This exercise can place a lot of stress on your foot and ankle. Please complete this exercise only if specifically instructed by your caregiver.   Place the ball of your right / left foot on a step, keeping your other foot firmly on the same step.  Hold on to the wall or a rail for balance.  Slowly lift your other foot, allowing your body weight to press your heel down over the edge of the step.  You should feel a stretch in your right / left calf.  Hold this   position for 10 seconds.  Repeat this exercise with a slight bend in your right / left knee. Repeat 3 times. Complete this stretch 2 times per day.   STRENGTHENING EXERCISES - Plantar Fasciitis (Heel Spur Syndrome)  These exercises may help you when beginning to rehabilitate your injury. They may resolve your symptoms with or without further involvement from your physician, physical therapist or athletic trainer. While completing these exercises, remember:   Muscles can gain both the endurance and the strength needed for everyday activities through controlled exercises.  Complete these exercises as instructed by your physician, physical therapist or athletic trainer. Progress the resistance and repetitions only as guided.  STRENGTH -  Towel Curls  Sit in a chair positioned on a non-carpeted surface.  Place your foot on a towel, keeping your heel on the floor.  Pull the towel toward your heel by only curling your toes. Keep your heel on the floor. Repeat 3 times. Complete this exercise 2 times per day.  STRENGTH - Ankle Inversion  Secure one end of a rubber exercise band/tubing to a fixed object (table, pole). Loop the other end around your foot just before your toes.  Place your fists between your knees. This will focus your strengthening at your ankle.  Slowly, pull your big toe up and in, making sure the band/tubing is positioned to resist the entire motion.  Hold this position for 10 seconds.  Have your muscles resist the band/tubing as it slowly pulls your foot back to the starting position. Repeat 3 times. Complete this exercises 2 times per day.  Document Released: 05/29/2005 Document Revised: 08/21/2011 Document Reviewed: 09/10/2008 [employer] Patient Information 2014 [employer]. 

## 2020-12-31 NOTE — Progress Notes (Signed)
Subjective:   Patient ID: Lynn Morales, female   DOB: 50 y.o.   MRN: [id number]   HPI 50 year old female presents the office today for concerns of right heel pain which is been ongoing for many years.  She has tried changing shoes which does help.  She has no other treatment.  Denies any recent injury or trauma.  No numbness or tingling.  No swelling.  Pain is worse when she first gets up and also the end of the day after being on her feet all day.  Also asking for refill of urea lotion as this was helpful for dry skin on her heels that was prescribed by another podiatrist previously.   Review of Systems  All other systems reviewed and are negative.  History reviewed. No pertinent past medical history.  History reviewed. No pertinent surgical history.  No current outpatient medications on file.  Allergies  Allergen Reactions   Bactrim [Sulfamethoxazole-Trimethoprim] Other (See Comments)    Abdominal cramps          Objective:  Physical Exam  General: AAO x3, NAD  Dermatological: Dry skin present with heels without significant skin breakdown or fissuring.  No lesions.  Vascular: Dorsalis Pedis artery and Posterior Tibial artery pedal pulses are 2/4 bilateral with immedate capillary fill time. Pedal hair growth present. No varicosities and no lower extremity edema present bilateral. There is no pain with calf compression, swelling, warmth, erythema.   Neruologic: Grossly intact via light touch bilateral.  Negative Tinel sign.  Musculoskeletal: Tenderness to palpation along the plantar medial tubercle of the calcaneus at the insertion of plantar fascia on the right foot. There is no pain along the course of the plantar fascia within the arch of the foot. Plantar fascia appears to be intact. There is no pain with lateral compression of the calcaneus or pain with vibratory sensation. There is no pain along the course or insertion of the achilles tendon. No other areas of tenderness  to bilateral lower extremities. Muscular strength 5/5 in all groups tested bilateral.  No pain on left side  Gait: Unassisted, Nonantalgic.       Assessment:   Right heel pain, plantar fasciitis; dry skin on heels     Plan:  -Treatment options discussed including all alternatives, risks, and complications -Etiology of symptoms were discussed -X-rays were obtained and reviewed with the patient. I independently reviewed. Heel spur present as well as pronation deformity. No fracture. Await radiology report.  -Offered steroid injection but she decides to hold off on this.  Prescribe meloxicam discussed side effects.  Plantar fascial brace dispensed.  Discussed stretching, icing daily.  Discussed shoe modifications and arch supports -Refilled urea lotion  Return if symptoms worsen or fail to improve.  Vivi Barrack DPM

## 2021-02-25 ENCOUNTER — Encounter: Payer: Self-pay | Admitting: Emergency Medicine

## 2021-02-25 ENCOUNTER — Emergency Department
Admission: EM | Admit: 2021-02-25 | Discharge: 2021-02-25 | Disposition: A | Discharge status: Home / Self Care | Payer: Medicaid Other | Source: Home / Self Care | Attending: Family Medicine | Admitting: Family Medicine

## 2021-02-25 DIAGNOSIS — J3089 Other allergic rhinitis: Secondary | ICD-10-CM

## 2021-02-25 DIAGNOSIS — N3090 Cystitis, unspecified without hematuria: Secondary | ICD-10-CM | POA: Diagnosis not present

## 2021-02-25 DIAGNOSIS — R300 Dysuria: Secondary | ICD-10-CM

## 2021-02-25 LAB — POCT URINALYSIS DIP (MANUAL ENTRY)
Bilirubin, UA: NEGATIVE
Glucose, UA: NEGATIVE mg/dL
Ketones, POC UA: NEGATIVE mg/dL
Nitrite, UA: NEGATIVE
Protein Ur, POC: NEGATIVE mg/dL
Spec Grav, UA: 1.015 (ref 1.010–1.025)
Urobilinogen, UA: 0.2 E.U./dL
pH, UA: 7.5 (ref 5.0–8.0)

## 2021-02-25 MED ORDER — FLUNISOLIDE 25 MCG/ACT (0.025%) NA SOLN: NASAL | 1 refills | Status: AC

## 2021-02-25 MED ORDER — NITROFURANTOIN MONOHYD MACRO 100 MG PO CAPS: ORAL_CAPSULE | ORAL | 0 refills | Status: AC

## 2021-02-25 NOTE — Discharge Instructions (Addendum)
Increase fluid intake.  May use non-prescription AZO for about two days, if desired, to decrease urinary discomfort.   Continue antihistamine such as Allegra.  Try Pseudoephedrine (30mg , one or two every 4 to 6 hours) for sinus congestion.   Also recommend using saline nasal spray several times daily and saline nasal irrigation (AYR is a common brand).

## 2021-02-25 NOTE — ED Provider Notes (Signed)
[facility]    CSN: [id number] Arrival date & time: 02/25/21  0858      History   Chief Complaint Chief Complaint  Patient presents with   Bladder Infection    HPI Lynn Morales is a 50 y.o. female.   Patient suddenly developed dysuria and mild hematuria last night.  She denies abdominal/pelvic pain, fever, nausea/vomiting, etc.  No LMP recorded. (Menstrual status: IUD).  She also requests an RX for Nasalide nasal spray for her perennial rhinitis.  She has tried her husband's prescription which she found to be more effective than Flonase.  The history is provided by the patient.  Dysuria Pain quality:  Burning Pain severity:  Mild Onset quality:  Sudden Duration:  1 day Timing:  Constant Progression:  Unchanged Chronicity:  New Recent urinary tract infections: no   Relieved by:  None tried Worsened by:  Nothing Ineffective treatments:  None tried Urinary symptoms: frequent urination, hematuria and hesitancy   Urinary symptoms: no discolored urine, no foul-smelling urine and no bladder incontinence   Associated symptoms: no abdominal pain, no fever, no flank pain, no genital lesions, no nausea, no vaginal discharge and no vomiting    History reviewed. No pertinent past medical history.  There are no problems to display for this patient.   History reviewed. No pertinent surgical history.  OB History     Gravida  4   Para  2   Term  2   Preterm  0   AB  2   Living  2      SAB  1   IAB  1   Ectopic  0   Multiple      Live Births  2            Home Medications    Prior to Admission medications   Medication Sig Start Date End Date Taking? Authorizing Provider  flunisolide (NASALIDE) 25 MCG/ACT (0.025%) SOLN Place 2 sprays in each nostril BID 02/25/21  Yes Isayah Ignasiak, Tera Mater, MD  meloxicam (MOBIC) 15 MG tablet Take 1 tablet (15 mg total) by mouth daily as needed for pain. 12/31/20 12/31/21 Yes Vivi Barrack, DPM   nitrofurantoin, macrocrystal-monohydrate, (MACROBID) 100 MG capsule Take one cap PO Q12hr with food. 02/25/21  Yes Lattie Haw, MD  Urea 40 % LOTN Apply 1 application topically daily as needed. 12/31/20  Yes Vivi Barrack, DPM    Family History Family History  Problem Relation Age of Onset   Diabetes Mother     Social History Social History   Tobacco Use   Smoking status: Never   Smokeless tobacco: Never  Vaping Use   Vaping Use: Never used  Substance Use Topics   Alcohol use: Never   Drug use: Never     Allergies   Bactrim [sulfamethoxazole-trimethoprim]   Review of Systems Review of Systems  Constitutional:  Negative for appetite change, chills, diaphoresis, fatigue and fever.  HENT:  Positive for rhinorrhea.   Gastrointestinal:  Negative for abdominal pain, nausea and vomiting.  Genitourinary:  Positive for dysuria, frequency, hematuria and urgency. Negative for flank pain, pelvic pain and vaginal discharge.  All other systems reviewed and are negative.   Physical Exam Triage Vital Signs ED Triage Vitals  Enc Vitals Group     BP 02/25/21 0917 128/84     Pulse Rate 02/25/21 0917 75     Resp --      Temp 02/25/21 0917 98.7 F (37.1 C)  Temp Source 02/25/21 0917 Oral     SpO2 02/25/21 0917 97 %     Weight 02/25/21 0919 151 lb (68.5 kg)     Height 02/25/21 0919 5\' 1"  (1.549 m)     Head Circumference --      Peak Flow --      Pain Score 02/25/21 0919 6     Pain Loc --      Pain Edu? --      Excl. in GC? --    No data found.  Updated Vital Signs BP 128/84 (BP Location: Right Arm)   Pulse 75   Temp 98.7 F (37.1 C) (Oral)   Ht 5\' 1"  (1.549 m)   Wt 68.5 kg   SpO2 97%   BMI 28.53 kg/m   Visual Acuity Right Eye Distance:   Left Eye Distance:   Bilateral Distance:    Right Eye Near:   Left Eye Near:    Bilateral Near:     Physical Exam Nursing notes and Vital Signs reviewed. Appearance:  Patient appears stated age, and in no acute  distress.    Eyes:  Pupils are equal, round, and reactive to light and accomodation.  Extraocular movement is intact.  Conjunctivae are not inflamed   Pharynx:  Normal; moist mucous membranes  Neck:  Supple.  No adenopathy Lungs:  Clear to auscultation.  Breath sounds are equal.  Moving air well. Heart:  Regular rate and rhythm without murmurs, rubs, or gallops.  Abdomen:  Nontender without masses or hepatosplenomegaly.  Bowel sounds are present.  No CVA or flank tenderness.  Extremities:  No edema.  Skin:  No rash present.     UC Treatments / Results  Labs (all labs ordered are listed, but only abnormal results are displayed) Labs Reviewed  POCT URINALYSIS DIP (MANUAL ENTRY) - Abnormal; Notable for the following components:      Result Value   Blood, UA moderate (*)    Leukocytes, UA Trace (*)    All other components within normal limits  URINE CULTURE    EKG   Radiology No results found.  Procedures Procedures (including critical care time)  Medications Ordered in UC Medications - No data to display  Initial Impression / Assessment and Plan / UC Course  I have reviewed the triage vital signs and the nursing notes.  Pertinent labs & imaging results that were available during my care of the patient were reviewed by me and considered in my medical decision making (see chart for details).    Urine culture pending.  Begin Macrobid. Rx for Nasarel at patient's request. Followup with Family Doctor if not improved in one week.   Final Clinical Impressions(s) / UC Diagnoses   Final diagnoses:  Dysuria  Cystitis  Perennial allergic rhinitis     Discharge Instructions      Increase fluid intake.  May use non-prescription AZO for about two days, if desired, to decrease urinary discomfort.   Continue antihistamine such as Allegra.  Try Pseudoephedrine (30mg , one or two every 4 to 6 hours) for sinus congestion.   Also recommend using saline nasal spray several times  daily and saline nasal irrigation (AYR is a common brand).        ED Prescriptions     Medication Sig Dispense Auth. Provider   nitrofurantoin, macrocrystal-monohydrate, (MACROBID) 100 MG capsule Take one cap PO Q12hr with food. 14 capsule 02/27/21, MD   flunisolide (NASALIDE) 25 MCG/ACT (0.025%) SOLN Place 2 sprays  in each nostril BID 25 mL Lattie Haw, MD         Lattie Haw, MD 02/27/21 [phone]

## 2021-02-25 NOTE — ED Triage Notes (Signed)
Patient states that she feels she has a bladder infection.  Patient having dysuria and some hematuria x 1 day.

## 2021-02-27 LAB — URINE CULTURE
MICRO NUMBER:: 12384913
SPECIMEN QUALITY:: ADEQUATE

## 2022-09-16 IMAGING — DX DG FOOT COMPLETE 3+V*R*
3 series · 3 of 3 positions shown · non-contrast
Comparison: None.

CLINICAL DATA: Chronic right heel pain with weight-bearing.

EXAM:
RIGHT FOOT COMPLETE - 3+ VIEW

[foot ap]
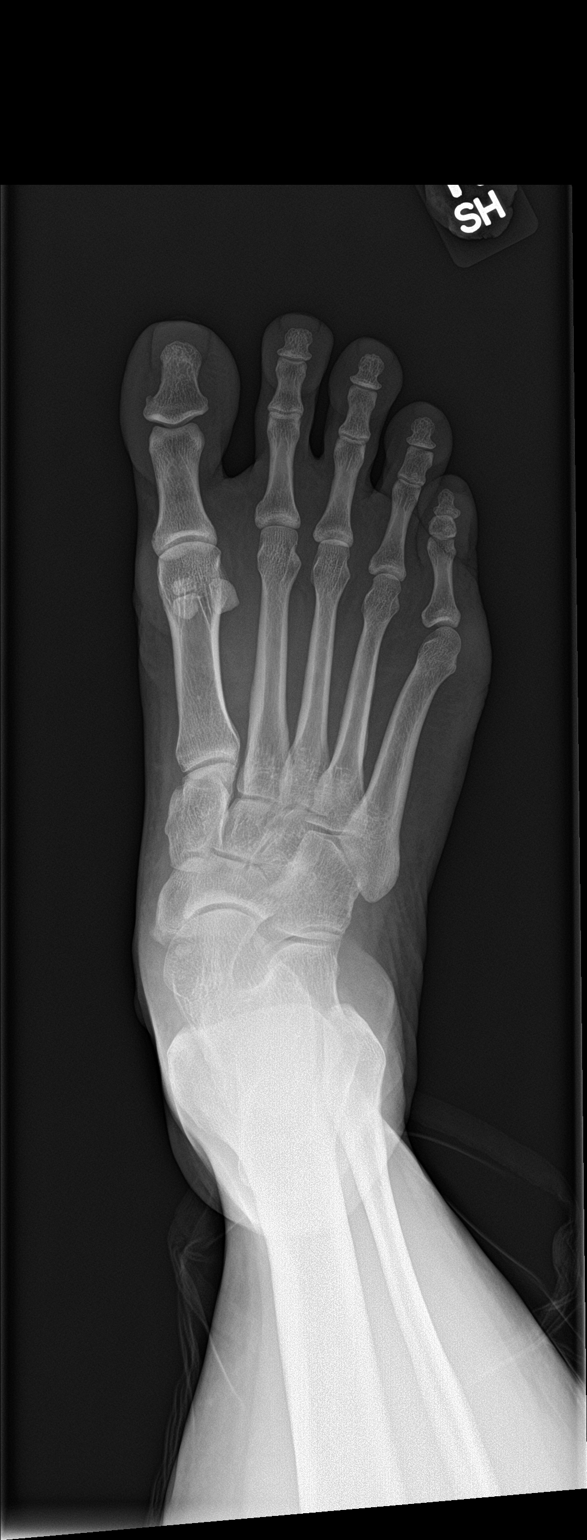

[foot obl]
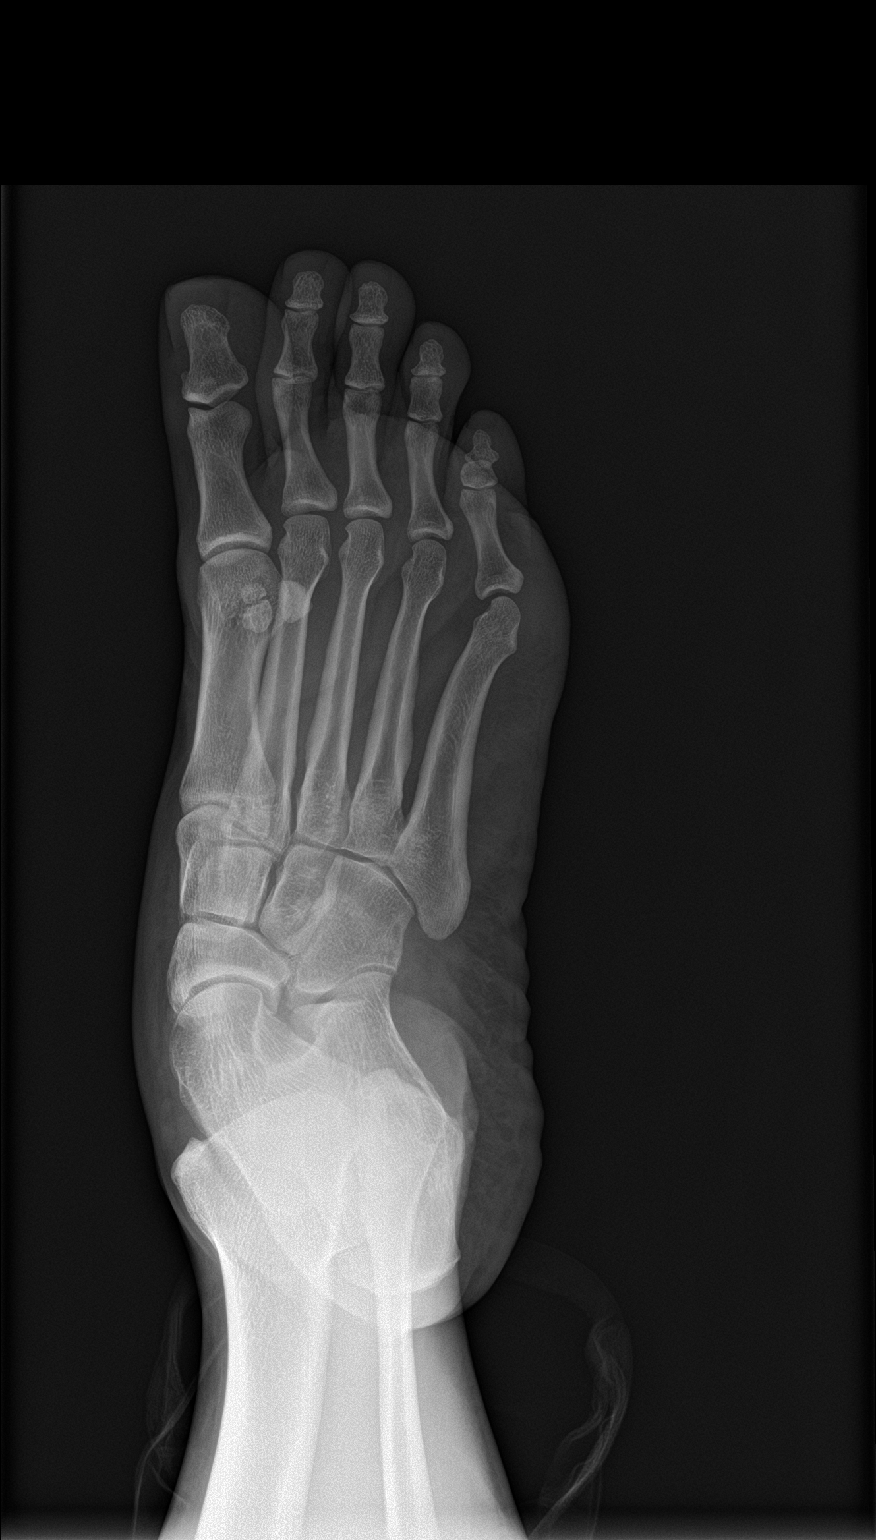

[foot lat]
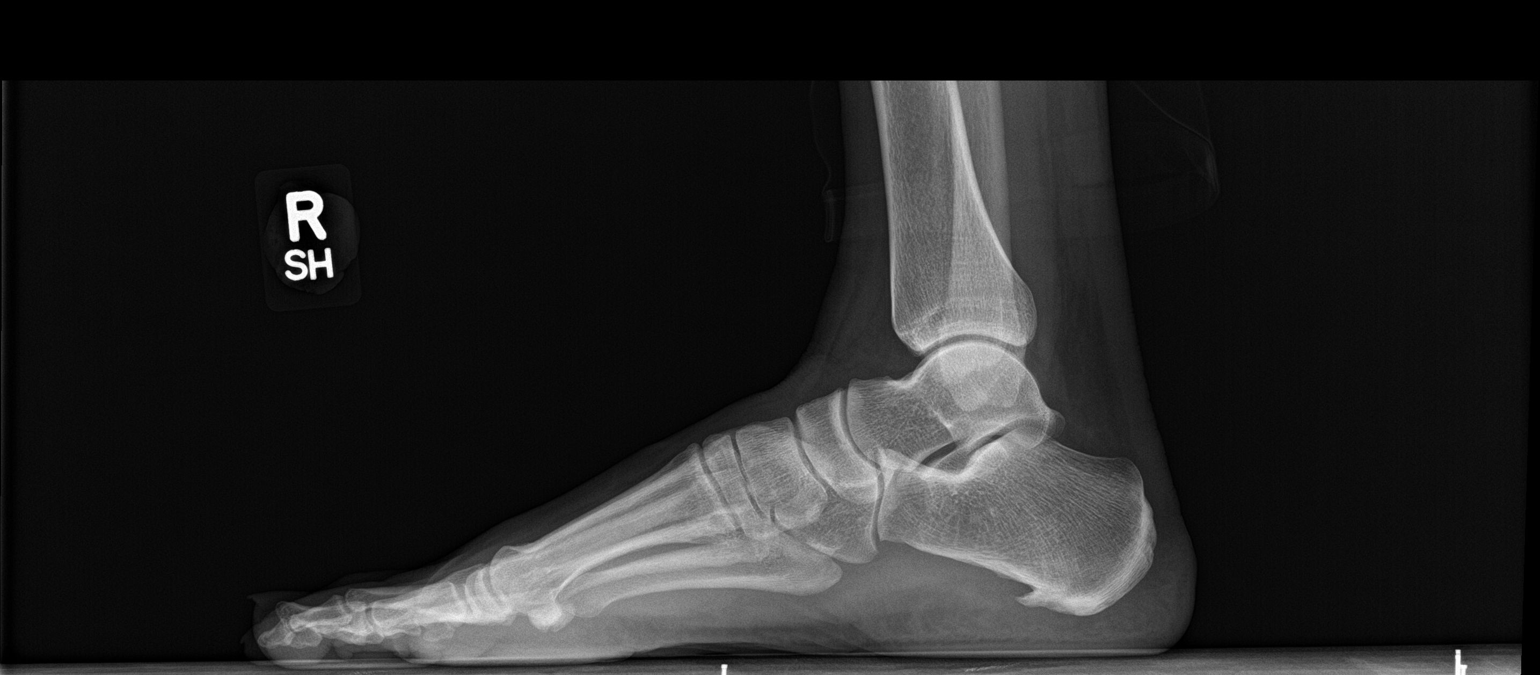

[3 of 3 positions shown; findings below may reference images not displayed]

FINDINGS: The mineralization and alignment are normal. There is no evidence of
acute fracture or dislocation. The joint spaces are preserved. The
tibial sesamoid of the 1st metatarsal is bipartite. There is a
probable small accessory navicular. There is a small plantar
calcaneal spur. The soft tissues appear unremarkable.
IMPRESSION: No acute osseous findings or significant arthropathic changes. Small
plantar calcaneal spur.
